# Patient Record
Sex: Male | Born: 1980 | Race: White | Hispanic: No | Marital: Single | Smoking: Former smoker
Health system: Southern US, Community
[De-identification: ages and names within clinical notes are randomized; demographics above are authoritative.]

## PROBLEM LIST (undated history)

## (undated) DIAGNOSIS — G4730 Sleep apnea, unspecified: Secondary | ICD-10-CM

## (undated) DIAGNOSIS — Z87442 Personal history of urinary calculi: Secondary | ICD-10-CM

## (undated) DIAGNOSIS — F909 Attention-deficit hyperactivity disorder, unspecified type: Secondary | ICD-10-CM

## (undated) HISTORY — DX: Sleep apnea, unspecified: G47.30

## (undated) HISTORY — DX: Attention-deficit hyperactivity disorder, unspecified type: F90.9

## (undated) HISTORY — DX: Morbid (severe) obesity due to excess calories: E66.01

## (undated) HISTORY — PX: NO PAST SURGERIES: SHX2092

---

## 2014-09-03 ENCOUNTER — Ambulatory Visit: Payer: 59 | Attending: Otolaryngology

## 2014-09-03 DIAGNOSIS — E669 Obesity, unspecified: Secondary | ICD-10-CM | POA: Diagnosis not present

## 2014-09-03 DIAGNOSIS — G4733 Obstructive sleep apnea (adult) (pediatric): Secondary | ICD-10-CM | POA: Diagnosis present

## 2014-09-03 DIAGNOSIS — R0683 Snoring: Secondary | ICD-10-CM | POA: Diagnosis not present

## 2016-03-31 ENCOUNTER — Encounter: Payer: Self-pay | Admitting: Surgery

## 2016-03-31 ENCOUNTER — Telehealth: Payer: Self-pay

## 2016-03-31 ENCOUNTER — Ambulatory Visit (INDEPENDENT_AMBULATORY_CARE_PROVIDER_SITE_OTHER): Payer: 59 | Admitting: Surgery

## 2016-03-31 VITALS — BP 142/84 | HR 76 | Temp 97.8°F | Ht 67.0 in | Wt 338.0 lb

## 2016-03-31 DIAGNOSIS — K429 Umbilical hernia without obstruction or gangrene: Secondary | ICD-10-CM | POA: Diagnosis not present

## 2016-03-31 DIAGNOSIS — G4730 Sleep apnea, unspecified: Secondary | ICD-10-CM | POA: Insufficient documentation

## 2016-03-31 HISTORY — DX: Morbid (severe) obesity due to excess calories: E66.01

## 2016-03-31 HISTORY — DX: Sleep apnea, unspecified: G47.30

## 2016-03-31 NOTE — Patient Instructions (Signed)
Please look at your Blue Sheet in case you have any questions about your surgery. 

## 2016-03-31 NOTE — Telephone Encounter (Signed)
Thank you for your online Notification/Prior Authorization submission.  The notification/prior authorization case information was transmitted on 03/31/2016 at 1:07 PM CST. The notification/prior authorization reference number is [id number]   CPT [id number]

## 2016-03-31 NOTE — Telephone Encounter (Signed)
Patient has been advised of Surgery Date as well as Pre-Admission appointment date, time, and location.  Surgery Date: 04/10/16  Pre-admit Appointment: 04/05/16 from 1p-5p  Patient has been advised to call [phone] the day before surgery between 1-3pm to obtain arrival time.

## 2016-03-31 NOTE — Progress Notes (Signed)
  Surgical Consultation  03/31/2016  Gabriel Reeves is an 36 y.o. male.   [facility]  HPI: This a patient with a painful umbilical hernia since December. He's had discomfort and some nausea but no emesis and no sign of incarceration. He does a lot of heavy lifting at work. He has no other symptoms.  He is a Nutritional therapistplumber stop smoking 7 years ago does not drink alcohol and has never been a heavy drinker.  No family history of hernias.  Past Medical History:  Diagnosis Date  . Morbid obesity (HCC) 03/31/2016  . Sleep apnea 03/31/2016    Past Surgical History:  Procedure Laterality Date  . NO PAST SURGERIES      Family History  Problem Relation Age of Onset  . Parkinson's disease Father   . Cancer Father     skin  . Healthy Mother     Social History:  reports that he quit smoking about 7 years ago. His smoking use included Cigarettes. He has a 2.00 pack-year smoking history. He has never used smokeless tobacco. He reports that he drinks alcohol. He reports that he does not use drugs.  Allergies:  Allergies  Allergen Reactions  . Bee Venom     Medications reviewed.   Review of Systems:   Review of Systems  Constitutional: Negative.   HENT: Negative.   Eyes: Negative.   Respiratory: Negative.   Cardiovascular: Negative.   Gastrointestinal: Positive for abdominal pain. Negative for constipation, diarrhea, heartburn, nausea and vomiting.  Genitourinary: Negative.   Musculoskeletal: Negative.   Skin: Negative.   Neurological: Negative.   Endo/Heme/Allergies: Negative.   Psychiatric/Behavioral: Negative.      Physical Exam:  BP (!) 142/84   Pulse 76   Temp 97.8 F (36.6 C) (Oral)   Ht 5\' 7"  (1.702 m)   Wt (!) 338 lb (153.3 kg)   BMI 52.94 kg/m   Physical Exam  Constitutional: He is oriented to person, place, and time. No distress.  Morbidly obese in no acute distress  HENT:  Head: Normocephalic and atraumatic.  Eyes: Pupils are equal, round, and reactive to light.  Right eye exhibits no discharge. Left eye exhibits no discharge. No scleral icterus.  Neck: Normal range of motion.  Cardiovascular: Normal rate, regular rhythm and normal heart sounds.   Pulmonary/Chest: Effort normal. No respiratory distress. He has no wheezes. He has no rales.  Abdominal: Soft. He exhibits no distension. There is no tenderness. There is no rebound and no guarding.  Rectus diastases Umbilical hernia with partial incarceration of likely fatty tissue. Nontender nonerythematous  Musculoskeletal: Normal range of motion. He exhibits no edema or tenderness.  Lymphadenopathy:    He has no cervical adenopathy.  Neurological: He is alert and oriented to person, place, and time.  Skin: Skin is warm and dry. No rash noted. No erythema.  Psychiatric: Mood and affect normal.  Vitals reviewed.     No results found for this or any previous visit (from the past 48 hour(s)). No results found.  Assessment/Plan: This a patient with an umbilical hernia with discomfort. I discussed with him the rationale for surgery the options of observation and the risk of bleeding infection primary repair and recurrence mesh placement mesh infection as well as cosmetic deformity he understood and agreed with this plan.  Lattie Hawichard E Breauna Mazzeo, MD, FACS

## 2016-04-05 ENCOUNTER — Encounter
Admission: RE | Admit: 2016-04-05 | Discharge: 2016-04-05 | Disposition: A | Discharge status: Home / Self Care | Payer: 59 | Source: Ambulatory Visit | Attending: Surgery | Admitting: Surgery

## 2016-04-05 DIAGNOSIS — Z01818 Encounter for other preprocedural examination: Secondary | ICD-10-CM | POA: Diagnosis not present

## 2016-04-05 HISTORY — DX: Personal history of urinary calculi: Z87.442

## 2016-04-05 LAB — CBC WITH DIFFERENTIAL/PLATELET
BASOS ABS: 0.1 10*3/uL (ref 0–0.1)
BASOS PCT: 1 %
Eosinophils Absolute: 0.3 10*3/uL (ref 0–0.7)
Eosinophils Relative: 2 %
HEMATOCRIT: 44.4 % (ref 40.0–52.0)
HEMOGLOBIN: 14.7 g/dL (ref 13.0–18.0)
LYMPHS PCT: 24 %
Lymphs Abs: 2.8 10*3/uL (ref 1.0–3.6)
MCH: 29.2 pg (ref 26.0–34.0)
MCHC: 33 g/dL (ref 32.0–36.0)
MCV: 88.3 fL (ref 80.0–100.0)
MONO ABS: 0.7 10*3/uL (ref 0.2–1.0)
MONOS PCT: 6 %
NEUTROS ABS: 7.6 10*3/uL — AB (ref 1.4–6.5)
NEUTROS PCT: 67 %
Platelets: 259 10*3/uL (ref 150–440)
RBC: 5.02 MIL/uL (ref 4.40–5.90)
RDW: 12.6 % (ref 11.5–14.5)
WBC: 11.4 10*3/uL — ABNORMAL HIGH (ref 3.8–10.6)

## 2016-04-05 LAB — BASIC METABOLIC PANEL
ANION GAP: 8 (ref 5–15)
BUN: 15 mg/dL (ref 6–20)
CALCIUM: 9.2 mg/dL (ref 8.9–10.3)
CO2: 28 mmol/L (ref 22–32)
Chloride: 103 mmol/L (ref 101–111)
Creatinine, Ser: 0.78 mg/dL (ref 0.61–1.24)
GFR calc non Af Amer: >60 mL/min (ref >60)
GLUCOSE: 106 mg/dL — AB (ref 65–99)
POTASSIUM: 4.1 mmol/L (ref 3.5–5.1)
Sodium: 139 mmol/L (ref 135–145)

## 2016-04-05 NOTE — Patient Instructions (Signed)
  Your procedure is scheduled on: 04-10-16 [facility](MONDAY) Report to Same Day Surgery 2nd floor medical mall [facility](Medical Mall Entrance-take elevator on left to 2nd floor.  Check in with surgery information desk.) To find out your arrival time please call [phone] between 1PM - 3PM on 04-07-16 (FRIDAY)   Remember: Instructions that are not followed completely may result in serious medical risk, up to and including death, or upon the discretion of your surgeon and anesthesiologist your surgery may need to be rescheduled.    _x___ 1. Do not eat food or drink liquids after midnight. No gum chewing or hard candies.     __x__ 2. No Alcohol for 24 hours before or after surgery.   __x__3. No Smoking for 24 prior to surgery.   ____  4. Bring all medications with you on the day of surgery if instructed.    __x__ 5. Notify your doctor if there is any change in your medical condition     (cold, fever, infections).     Do not wear jewelry, make-up, hairpins, clips or nail polish.  Do not wear lotions, powders, or perfumes. You may wear deodorant.  Do not shave 48 hours prior to surgery. Men may shave face and neck.  Do not bring valuables to the hospital.    [facility] is not responsible for any belongings or valuables.               Contacts, dentures or bridgework may not be worn into surgery.  Leave your suitcase in the car. After surgery it may be brought to your room.  For patients admitted to the hospital, discharge time is determined by your treatment team.   Patients discharged the day of surgery will not be allowed to drive home.  You will need someone to drive you home and stay with you the night of your procedure.    Please read over the following fact sheets that you were given:   [facility] Preparing for Surgery and or MRSA Information   ____ Take these medicines the morning of surgery with A SIP OF WATER:     1. NONE  2.  3.  4.  5.  6.  ____Fleets enema or Magnesium Citrate as  directed.   _x___ Use CHG Soap or sage wipes as directed on instruction sheet   ____ Use inhalers on the day of surgery and bring to hospital day of surgery  ____ Stop metformin 2 days prior to surgery    ____ Take 1/2 of usual insulin dose the night before surgery and none on the morning of surgery.   ____ Stop Aspirin, Coumadin, Pllavix ,Eliquis, Effient, or Pradaxa  x__ Stop Anti-inflammatories such as Advil, Aleve, Ibuprofen, Motrin, Naproxen,          Naprosyn, Goodies powders or aspirin products NOW-Ok to take Tylenol.   ____ Stop supplements until after surgery.    _X___ Bring C-Pap to the hospital.

## 2016-04-07 ENCOUNTER — Telehealth: Payer: Self-pay

## 2016-04-07 NOTE — Telephone Encounter (Signed)
Received slightly elevated White Blood Cell count. Call made to patient. He has no active signs/symptoms of infection of any type. He has not been sick lately either but has been stressed more than normal, he states. He has been encouraged to call with any signs of sickness that he experiences. He verbalizes understanding.  No further orders at this time.

## 2016-04-07 NOTE — Telephone Encounter (Signed)
Prior Authorization received for CPT code 9604549585 and Reference [id number].

## 2016-04-10 ENCOUNTER — Ambulatory Visit
Admission: RE | Admit: 2016-04-10 | Discharge: 2016-04-10 | Disposition: A | Discharge status: Home / Self Care | Payer: 59 | Source: Ambulatory Visit | Attending: Surgery | Admitting: Surgery

## 2016-04-10 ENCOUNTER — Encounter: Payer: Self-pay | Admitting: *Deleted

## 2016-04-10 ENCOUNTER — Ambulatory Visit: Payer: 59 | Admitting: Anesthesiology

## 2016-04-10 ENCOUNTER — Telehealth: Payer: Self-pay

## 2016-04-10 ENCOUNTER — Encounter
Admission: RE | Disposition: A | Discharge status: Home / Self Care | Payer: Self-pay | Source: Ambulatory Visit | Attending: Surgery

## 2016-04-10 DIAGNOSIS — G4730 Sleep apnea, unspecified: Secondary | ICD-10-CM | POA: Insufficient documentation

## 2016-04-10 DIAGNOSIS — Z87891 Personal history of nicotine dependence: Secondary | ICD-10-CM | POA: Insufficient documentation

## 2016-04-10 DIAGNOSIS — K429 Umbilical hernia without obstruction or gangrene: Secondary | ICD-10-CM | POA: Diagnosis not present

## 2016-04-10 HISTORY — PX: UMBILICAL HERNIA REPAIR: SHX196

## 2016-04-10 SURGERY — REPAIR, HERNIA, UMBILICAL, ADULT
Anesthesia: General | Wound class: Clean

## 2016-04-10 MED ORDER — FAMOTIDINE 20 MG PO TABS
ORAL_TABLET | ORAL | Status: AC
  Administered 2016-04-10: 20 mg via ORAL
  Filled 2016-04-10: qty 1

## 2016-04-10 MED ORDER — HEPARIN SODIUM (PORCINE) 5000 UNIT/ML IJ SOLN
INTRAMUSCULAR | Status: AC
  Administered 2016-04-10: 5000 [IU] via SUBCUTANEOUS
  Filled 2016-04-10: qty 1

## 2016-04-10 MED ORDER — HYDROCODONE-ACETAMINOPHEN 5-300 MG PO TABS: 1.0000 | ORAL_TABLET | ORAL | 0 refills | Status: DC | PRN

## 2016-04-10 MED ORDER — HEPARIN SODIUM (PORCINE) 5000 UNIT/ML IJ SOLN
5000.0000 [IU] | Freq: Once | INTRAMUSCULAR | Status: AC
  Administered 2016-04-10: 5000 [IU] via SUBCUTANEOUS

## 2016-04-10 MED ORDER — CHLORHEXIDINE GLUCONATE CLOTH 2 % EX PADS: 6.0000 | MEDICATED_PAD | Freq: Once | CUTANEOUS | Status: DC

## 2016-04-10 MED ORDER — ACETAMINOPHEN 10 MG/ML IV SOLN
INTRAVENOUS | Status: DC | PRN
  Administered 2016-04-10: 1000 mg via INTRAVENOUS

## 2016-04-10 MED ORDER — LACTATED RINGERS IV SOLN
INTRAVENOUS | Status: DC
  Administered 2016-04-10 (×2): via INTRAVENOUS

## 2016-04-10 MED ORDER — FENTANYL CITRATE (PF) 100 MCG/2ML IJ SOLN
INTRAMUSCULAR | Status: AC
  Filled 2016-04-10: qty 2

## 2016-04-10 MED ORDER — ONDANSETRON HCL 4 MG/2ML IJ SOLN
INTRAMUSCULAR | Status: AC
  Filled 2016-04-10: qty 2

## 2016-04-10 MED ORDER — LIDOCAINE HCL 2 % EX GEL
CUTANEOUS | Status: AC
  Filled 2016-04-10: qty 5

## 2016-04-10 MED ORDER — DEXTROSE 5 % IV SOLN
3.0000 g | INTRAVENOUS | Status: AC
  Administered 2016-04-10: 3 g via INTRAVENOUS
  Filled 2016-04-10: qty 3000

## 2016-04-10 MED ORDER — SUCCINYLCHOLINE CHLORIDE 20 MG/ML IJ SOLN
INTRAMUSCULAR | Status: DC | PRN
  Administered 2016-04-10: 200 mg via INTRAVENOUS

## 2016-04-10 MED ORDER — OXYCODONE HCL 5 MG PO TABS
5.0000 mg | ORAL_TABLET | Freq: Once | ORAL | Status: AC | PRN
  Administered 2016-04-10: 5 mg via ORAL

## 2016-04-10 MED ORDER — SUCCINYLCHOLINE CHLORIDE 20 MG/ML IJ SOLN
INTRAMUSCULAR | Status: AC
  Filled 2016-04-10: qty 1

## 2016-04-10 MED ORDER — FAMOTIDINE 20 MG PO TABS
20.0000 mg | ORAL_TABLET | Freq: Once | ORAL | Status: AC
  Administered 2016-04-10: 20 mg via ORAL

## 2016-04-10 MED ORDER — LIDOCAINE HCL (PF) 2 % IJ SOLN
INTRAMUSCULAR | Status: AC
  Filled 2016-04-10: qty 2

## 2016-04-10 MED ORDER — ROCURONIUM BROMIDE 50 MG/5ML IV SOLN
INTRAVENOUS | Status: AC
  Filled 2016-04-10: qty 2

## 2016-04-10 MED ORDER — DEXAMETHASONE SODIUM PHOSPHATE 10 MG/ML IJ SOLN
INTRAMUSCULAR | Status: DC | PRN
  Administered 2016-04-10: 10 mg via INTRAVENOUS

## 2016-04-10 MED ORDER — LIDOCAINE HCL (CARDIAC) 20 MG/ML IV SOLN
INTRAVENOUS | Status: DC | PRN
  Administered 2016-04-10: 100 mg via INTRAVENOUS

## 2016-04-10 MED ORDER — BUPIVACAINE-EPINEPHRINE (PF) 0.25% -1:200000 IJ SOLN
INTRAMUSCULAR | Status: AC
  Filled 2016-04-10: qty 30

## 2016-04-10 MED ORDER — OXYCODONE HCL 5 MG/5ML PO SOLN: 5.0000 mg | Freq: Once | ORAL | Status: AC | PRN

## 2016-04-10 MED ORDER — FENTANYL CITRATE (PF) 100 MCG/2ML IJ SOLN
25.0000 ug | INTRAMUSCULAR | Status: DC | PRN
  Administered 2016-04-10 (×2): 50 ug via INTRAVENOUS

## 2016-04-10 MED ORDER — ACETAMINOPHEN 10 MG/ML IV SOLN
INTRAVENOUS | Status: AC
  Filled 2016-04-10: qty 100

## 2016-04-10 MED ORDER — PROPOFOL 10 MG/ML IV BOLUS
INTRAVENOUS | Status: AC
  Filled 2016-04-10: qty 40

## 2016-04-10 MED ORDER — DEXAMETHASONE SODIUM PHOSPHATE 10 MG/ML IJ SOLN
INTRAMUSCULAR | Status: AC
  Filled 2016-04-10: qty 1

## 2016-04-10 MED ORDER — SUGAMMADEX SODIUM 500 MG/5ML IV SOLN
INTRAVENOUS | Status: AC
  Filled 2016-04-10: qty 5

## 2016-04-10 MED ORDER — OXYCODONE HCL 5 MG PO TABS
ORAL_TABLET | ORAL | Status: AC
  Filled 2016-04-10: qty 1

## 2016-04-10 MED ORDER — ROCURONIUM BROMIDE 100 MG/10ML IV SOLN
INTRAVENOUS | Status: DC | PRN
  Administered 2016-04-10: 40 mg via INTRAVENOUS
  Administered 2016-04-10: 10 mg via INTRAVENOUS

## 2016-04-10 MED ORDER — BUPIVACAINE-EPINEPHRINE (PF) 0.25% -1:200000 IJ SOLN
INTRAMUSCULAR | Status: DC | PRN
  Administered 2016-04-10: 30 mL via PERINEURAL

## 2016-04-10 MED ORDER — PROPOFOL 10 MG/ML IV BOLUS
INTRAVENOUS | Status: DC | PRN
  Administered 2016-04-10: 300 mg via INTRAVENOUS

## 2016-04-10 MED ORDER — MIDAZOLAM HCL 2 MG/2ML IJ SOLN
INTRAMUSCULAR | Status: AC
  Filled 2016-04-10: qty 2

## 2016-04-10 MED ORDER — ONDANSETRON HCL 4 MG/2ML IJ SOLN
INTRAMUSCULAR | Status: DC | PRN
Start: 2016-04-10 — End: 2016-04-10
  Administered 2016-04-10: 4 mg via INTRAVENOUS

## 2016-04-10 MED ORDER — FENTANYL CITRATE (PF) 100 MCG/2ML IJ SOLN
INTRAMUSCULAR | Status: DC | PRN
  Administered 2016-04-10: 100 ug via INTRAVENOUS

## 2016-04-10 MED ORDER — SUGAMMADEX SODIUM 500 MG/5ML IV SOLN
INTRAVENOUS | Status: DC | PRN
  Administered 2016-04-10: 306.6 mg via INTRAVENOUS

## 2016-04-10 MED ORDER — MIDAZOLAM HCL 2 MG/2ML IJ SOLN
INTRAMUSCULAR | Status: DC | PRN
Start: 2016-04-10 — End: 2016-04-10
  Administered 2016-04-10: 2 mg via INTRAVENOUS

## 2016-04-10 SURGICAL SUPPLY — 31 items
ADHESIVE MASTISOL STRL (MISCELLANEOUS) ×3 IMPLANT
CANISTER SUCT 1200ML W/VALVE (MISCELLANEOUS) ×3 IMPLANT
CHLORAPREP W/TINT 26ML (MISCELLANEOUS) ×3 IMPLANT
CLOSURE WOUND 1/2 X4 (GAUZE/BANDAGES/DRESSINGS) ×1
CLOSURE WOUND 1/4X4 (GAUZE/BANDAGES/DRESSINGS) ×1
DRAPE LAPAROTOMY 100X77 ABD (DRAPES) ×3 IMPLANT
ELECT REM PT RETURN 9FT ADLT (ELECTROSURGICAL) ×3
ELECTRODE REM PT RTRN 9FT ADLT (ELECTROSURGICAL) ×1 IMPLANT
GAUZE SPONGE NON-WVN 2X2 STRL (MISCELLANEOUS) ×2 IMPLANT
GLOVE BIO SURGEON STRL SZ8 (GLOVE) ×6 IMPLANT
GOWN STRL REUS W/ TWL LRG LVL3 (GOWN DISPOSABLE) ×2 IMPLANT
GOWN STRL REUS W/TWL LRG LVL3 (GOWN DISPOSABLE) ×4
LABEL OR SOLS (LABEL) ×3 IMPLANT
MESH VENTRALEX ST 1-7/10 CRC S (Mesh General) ×3 IMPLANT
NDL SAFETY 22GX1.5 (NEEDLE) ×3 IMPLANT
NS IRRIG 500ML POUR BTL (IV SOLUTION) ×3 IMPLANT
PACK BASIN MINOR ARMC (MISCELLANEOUS) ×3 IMPLANT
SPONGE LAP 18X18 5 PK (GAUZE/BANDAGES/DRESSINGS) ×3 IMPLANT
SPONGE VERSALON 2X2 STRL (MISCELLANEOUS) ×4
STRIP CLOSURE SKIN 1/2X4 (GAUZE/BANDAGES/DRESSINGS) ×2 IMPLANT
STRIP CLOSURE SKIN 1/4X4 (GAUZE/BANDAGES/DRESSINGS) ×2 IMPLANT
SUT ETHIBOND 0 (SUTURE) ×3 IMPLANT
SUT ETHIBOND NAB CT1 #1 30IN (SUTURE) ×3 IMPLANT
SUT MNCRL 4-0 (SUTURE) ×2
SUT MNCRL 4-0 27XMFL (SUTURE) ×1
SUT PROLENE 0 CT 1 30 (SUTURE) ×15 IMPLANT
SUT PROLENE 1 CT 1 30 (SUTURE) ×12 IMPLANT
SUT VIC AB 3-0 SH 27 (SUTURE) ×4
SUT VIC AB 3-0 SH 27X BRD (SUTURE) ×2 IMPLANT
SUTURE MNCRL 4-0 27XMF (SUTURE) ×1 IMPLANT
SYRINGE 10CC LL (SYRINGE) ×3 IMPLANT

## 2016-04-10 NOTE — Op Note (Signed)
Abdominal Hernia Repair  Repair of umbilical hernia with mesh Pre-operative Diagnosis: Umbilical hernia  Post-operative Diagnosis: Umbilical hernia  Surgeon: Adah Salvageichard E. Excell Seltzerooper, MD FACS  Anesthesia: Gen. with endotracheal tube  Assistant: PA student  Procedure Details  The patient was seen again in the Holding Room. The benefits, complications, treatment options, and expected outcomes were discussed with the patient. The risks of bleeding, infection, recurrence of symptoms, failure to resolve symptoms, bowel injury, mesh placement, mesh infection, any of which could require further surgery were reviewed with the patient. The likelihood of improving the patient's symptoms with return to their baseline status is good.  The patient and/or family concurred with the proposed plan, giving informed consent.  The patient was taken to Operating Room, identified as Gabriel Reeves and the procedure verified.  A Time Out was held and the above information confirmed.  Prior to the induction of general anesthesia, antibiotic prophylaxis was administered. VTE prophylaxis was in place. General endotracheal anesthesia was then administered and tolerated well. After the induction, the abdomen was prepped with Chloraprep and draped in the sterile fashion. The patient was positioned in the supine position.  A curvilinear incision was made in the infraumbilical area and dissection down to the fascia was performed with electrocautery. The skin of the umbilicus was elevated and hernia was noted with incarcerated preperitoneal fat. This was reduced and a 1.5 cm rent was identified. With the patient's morbid obesity as well as the weak tissues noted and the size of the hernia was decided to place a 4.3 cm ventral X patch with coating into the abdominal cavity and tied at in with figure-of-eight and U sutures of 0 Prolene. Once this was complete additional Marcaine was placed for a total of 30 cc and then the skin of the  umbilicus was tacked to the fascia with 3-0 Vicryl. Deep sutures of 3-0 Vicryl were placed followed by 4-0 subcuticular Monocryl. Steri-Strips Mastisol sterile dressings were placed.  Patient was taken to recovery room in stable condition to be discharged care of his family and follow-up in 10 days  Findings:  Umbilical hernia with incarcerated preperitoneal fat  Estimated Blood Loss: Minimal         Drains: None         Specimens: None       Complications: none               Gabriel Reeves E. Excell Seltzerooper, MD, FACS

## 2016-04-10 NOTE — Telephone Encounter (Signed)
Dr. Excell Seltzerooper did a Umbilical hernia repair today and gave pt's fiance a rx for Vicodin 5/300mg  and his insurance will only cover Norvo 5/325mg . Pharmacist says you can escribe this medication. Please send to [facility]. Pt is due for his next dose at 6:00pm

## 2016-04-10 NOTE — Transfer of Care (Signed)
Immediate Anesthesia Transfer of Care Note  Patient: Gabriel Reeves  Procedure(s) Performed: Procedure(s): HERNIA REPAIR UMBILICAL ADULT (N/A)  Patient Location: PACU  Anesthesia Type:General  Level of Consciousness: awake and oriented  Airway & Oxygen Therapy: Patient Spontanous Breathing and Patient connected to face mask oxygen  Post-op Assessment: Report given to RN and Post -op Vital signs reviewed and stable  Post vital signs: Reviewed and stable  Last Vitals:  Vitals:   04/10/16 1106  BP: (!) 155/93  Pulse: 75  Resp: 18  Temp: 37 C    Last Pain:  Vitals:   04/10/16 1106  TempSrc: Tympanic  PainSc: 0-No pain         Complications: No apparent anesthesia complications

## 2016-04-10 NOTE — OR Nursing (Signed)
Upon admission patient reported that he thinks he might still have a contact in his left eye. He reports spending a lot of time last night trying to remove it without success. He attempted to remove it again today but reports the eye was too sensitive to mess with. Two nurses as well as Dr. Randa NgoPiscitello looked in the patient's eye without evidence of a contact still being in there. Patient really really wants to proceed with surgery today due to insurance reasons. Left eye with very reddened sclera but no obvious contact in place. After discussion with Dr. Randa NgoPiscitello it was decided to proceed with surgery. Dr. Excell Seltzerooper is also aware of the situation with this patient's eye also.

## 2016-04-10 NOTE — Anesthesia Post-op Follow-up Note (Cosign Needed)
Anesthesia QCDR form completed.        

## 2016-04-10 NOTE — Telephone Encounter (Signed)
Call made to pharmacy at this time. Spoke with Pharmacist and gave her verbal order to change prescription to Norco 5/325mg  tablets so that they would be covered by insurance.

## 2016-04-10 NOTE — Anesthesia Procedure Notes (Signed)
Procedure Name: Intubation Date/Time: 04/10/2016 12:23 PM Performed by: Nelda Marseille Pre-anesthesia Checklist: Patient identified, Patient being monitored, Timeout performed, Emergency Drugs available and Suction available Patient Re-evaluated:Patient Re-evaluated prior to inductionOxygen Delivery Method: Circle system utilized Preoxygenation: Pre-oxygenation with 100% oxygen Intubation Type: IV induction Ventilation: Mask ventilation without difficulty Laryngoscope Size: Mac and 3 Grade View: Grade IV Tube type: Oral Tube size: 7.5 mm Number of attempts: 1 Airway Equipment and Method: Stylet and Video-laryngoscopy Placement Confirmation: ETT inserted through vocal cords under direct vision,  positive ETCO2 and breath sounds checked- equal and bilateral Secured at: 25 cm Tube secured with: Tape Dental Injury: Teeth and Oropharynx as per pre-operative assessment  Difficulty Due To: Difficulty was anticipated, Difficult Airway- due to large tongue, Difficult Airway- due to reduced neck mobility and Difficult Airway-  due to edematous airway Future Recommendations: Recommend- induction with short-acting agent, and alternative techniques readily available Comments: Recommend intubation with video layrngoscopy always

## 2016-04-10 NOTE — Progress Notes (Signed)
Preoperative Review   Patient is met in the preoperative holding area. The history is reviewed in the chart and with the patient. I personally reviewed the options and rationale as well as the risks of this procedure that have been previously discussed with the patient. All questions asked by the patient and/or family were answered to their satisfaction.  Patient agrees to proceed with this procedure at this time.  Richard E Cooper M.D. FACS  

## 2016-04-10 NOTE — Discharge Instructions (Signed)
Remove dressing in 24 hours. °May shower in 24 hours. °Leave paper strips in place. °Resume all home medications. °Follow-up with Dr. Cooper in 10 days. ° ° ° ° ° °General Anesthesia, Adult, Care After °These instructions provide you with information about caring for yourself after your procedure. Your health care provider may also give you more specific instructions. Your treatment has been planned according to current medical practices, but problems sometimes occur. Call your health care provider if you have any problems or questions after your procedure. °What can I expect after the procedure? °After the procedure, it is common to have: °· Vomiting. °· A sore throat. °· Mental slowness. °It is common to feel: °· Nauseous. °· Cold or shivery. °· Sleepy. °· Tired. °· Sore or achy, even in parts of your body where you did not have surgery. °Follow these instructions at home: °For at least 24 hours after the procedure: °· Do not: °¨ Participate in activities where you could fall or become injured. °¨ Drive. °¨ Use heavy machinery. °¨ Drink alcohol. °¨ Take sleeping pills or medicines that cause drowsiness. °¨ Make important decisions or sign legal documents. °¨ Take care of children on your own. °· Rest. °Eating and drinking °· If you vomit, drink water, juice, or soup when you can drink without vomiting. °· Drink enough fluid to keep your urine clear or pale yellow. °· Make sure you have little or no nausea before eating solid foods. °· Follow the diet recommended by your health care provider. °General instructions °· Have a responsible adult stay with you until you are awake and alert. °· Return to your normal activities as told by your health care provider. Ask your health care provider what activities are safe for you. °· Take over-the-counter and prescription medicines only as told by your health care provider. °· If you smoke, do not smoke without supervision. °· Keep all follow-up visits as told by your health  care provider. This is important. °Contact a health care provider if: °· You continue to have nausea or vomiting at home, and medicines are not helpful. °· You cannot drink fluids or start eating again. °· You cannot urinate after 8-12 hours. °· You develop a skin rash. °· You have fever. °· You have increasing redness at the site of your procedure. °Get help right away if: °· You have difficulty breathing. °· You have chest pain. °· You have unexpected bleeding. °· You feel that you are having a life-threatening or urgent problem. °This information is not intended to replace advice given to you by your health care provider. Make sure you discuss any questions you have with your health care provider. °Document Released: 05/15/2000 Document Revised: 07/12/2015 Document Reviewed: 01/21/2015 °Elsevier Interactive Patient Education © 2017 [employer] ° °

## 2016-04-10 NOTE — Anesthesia Preprocedure Evaluation (Addendum)
Anesthesia Evaluation  Patient identified by MRN, date of birth, ID band Patient awake    Reviewed: Allergy & Precautions, H&P , NPO status , Patient's Chart, lab work & pertinent test results  Airway Mallampati: III  TM Distance: >3 FB Neck ROM: full    Dental  (+) Poor Dentition, Chipped   Pulmonary neg shortness of breath, sleep apnea and Continuous Positive Airway Pressure Ventilation , former smoker,    Pulmonary exam normal breath sounds clear to auscultation       Cardiovascular Exercise Tolerance: Good (-) angina(-) Past MI and (-) DOE negative cardio ROS Normal cardiovascular exam Rhythm:regular Rate:Normal     Neuro/Psych PSYCHIATRIC DISORDERS negative neurological ROS     GI/Hepatic Neg liver ROS, Controlled,  Endo/Other  negative endocrine ROS  Renal/GU      Musculoskeletal   Abdominal   Peds  Hematology negative hematology ROS (+)   Anesthesia Other Findings Baseline pain, erythema and injection in left eye.  Patient reports that he had trouble removing his contact from this eye last night, that there is no contact in that eye right now.  No contact visualized in that eye.  Patient told that he may have a baseline corneal abrasion in that eye.  Past Medical History: No date: ADHD No date: History of kidney stones 03/31/2016: Morbid obesity (HCC) 03/31/2016: Sleep apnea     Comment: USES CPAP  Past Surgical History: No date: NO PAST SURGERIES     Reproductive/Obstetrics negative OB ROS                            Anesthesia Physical Anesthesia Plan  ASA: III  Anesthesia Plan: General ETT   Post-op Pain Management:    Induction:   Airway Management Planned:   Additional Equipment:   Intra-op Plan:   Post-operative Plan:   Informed Consent: I have reviewed the patients History and Physical, chart, labs and discussed the procedure including the risks, benefits and  alternatives for the proposed anesthesia with the patient or authorized representative who has indicated his/her understanding and acceptance.   Dental Advisory Given  Plan Discussed with: Anesthesiologist, CRNA and Surgeon  Anesthesia Plan Comments:         Anesthesia Quick Evaluation

## 2016-04-10 NOTE — Anesthesia Postprocedure Evaluation (Signed)
Anesthesia Post Note  Patient: Rexene AgentSean Griffing  Procedure(s) Performed: Procedure(s) (LRB): HERNIA REPAIR UMBILICAL ADULT (N/A)  Patient location during evaluation: PACU Anesthesia Type: General Level of consciousness: awake and alert Pain management: pain level controlled Vital Signs Assessment: post-procedure vital signs reviewed and stable Respiratory status: spontaneous breathing, nonlabored ventilation, respiratory function stable and patient connected to nasal cannula oxygen Cardiovascular status: blood pressure returned to baseline and stable Postop Assessment: no signs of nausea or vomiting Anesthetic complications: no     Last Vitals:  Vitals:   04/10/16 1318 04/10/16 1325  BP:  119/88  Pulse: 72 71  Resp: 16 16  Temp:      Last Pain:  Vitals:   04/10/16 1318  TempSrc:   PainSc: 5                  Cleda MccreedyJoseph K Shley Dolby

## 2016-04-11 ENCOUNTER — Encounter: Payer: Self-pay | Admitting: Surgery

## 2016-04-18 ENCOUNTER — Encounter: Payer: 59 | Admitting: Surgery

## 2019-02-13 ENCOUNTER — Encounter: Payer: Self-pay | Admitting: Emergency Medicine

## 2019-02-13 ENCOUNTER — Emergency Department
Admission: EM | Admit: 2019-02-13 | Discharge: 2019-02-13 | Disposition: A | Discharge status: Home / Self Care | Payer: 59 | Attending: Emergency Medicine | Admitting: Emergency Medicine

## 2019-02-13 ENCOUNTER — Other Ambulatory Visit: Payer: Self-pay

## 2019-02-13 ENCOUNTER — Emergency Department: Payer: 59

## 2019-02-13 DIAGNOSIS — R109 Unspecified abdominal pain: Secondary | ICD-10-CM

## 2019-02-13 DIAGNOSIS — Z79899 Other long term (current) drug therapy: Secondary | ICD-10-CM | POA: Insufficient documentation

## 2019-02-13 DIAGNOSIS — N200 Calculus of kidney: Secondary | ICD-10-CM | POA: Diagnosis not present

## 2019-02-13 DIAGNOSIS — Z87891 Personal history of nicotine dependence: Secondary | ICD-10-CM | POA: Diagnosis not present

## 2019-02-13 LAB — URINALYSIS, COMPLETE (UACMP) WITH MICROSCOPIC
Bacteria, UA: NONE SEEN
Bilirubin Urine: NEGATIVE
Glucose, UA: NEGATIVE mg/dL
Ketones, ur: NEGATIVE mg/dL
Leukocytes,Ua: NEGATIVE
Nitrite: NEGATIVE
Protein, ur: NEGATIVE mg/dL
Specific Gravity, Urine: 1.011 (ref 1.005–1.030)
Squamous Epithelial / LPF: NONE SEEN (ref 0–5)
pH: 5 (ref 5.0–8.0)

## 2019-02-13 LAB — CBC
HCT: 43.7 % (ref 39.0–52.0)
Hemoglobin: 15.6 g/dL (ref 13.0–17.0)
MCH: 30.1 pg (ref 26.0–34.0)
MCHC: 35.7 g/dL (ref 30.0–36.0)
MCV: 84.4 fL (ref 80.0–100.0)
Platelets: 232 10*3/uL (ref 150–400)
RBC: 5.18 MIL/uL (ref 4.22–5.81)
RDW: 11.9 % (ref 11.5–15.5)
WBC: 10.1 10*3/uL (ref 4.0–10.5)
nRBC: 0 % (ref 0.0–0.2)

## 2019-02-13 LAB — BASIC METABOLIC PANEL
Anion gap: 13 (ref 5–15)
BUN: 14 mg/dL (ref 6–20)
CO2: 21 mmol/L — ABNORMAL LOW (ref 22–32)
Calcium: 8.8 mg/dL — ABNORMAL LOW (ref 8.9–10.3)
Chloride: 106 mmol/L (ref 98–111)
Creatinine, Ser: 0.7 mg/dL (ref 0.61–1.24)
GFR calc Af Amer: >60 mL/min (ref >60)
GFR calc non Af Amer: >60 mL/min (ref >60)
Glucose, Bld: 169 mg/dL — ABNORMAL HIGH (ref 70–99)
Potassium: 4.2 mmol/L (ref 3.5–5.1)
Sodium: 140 mmol/L (ref 135–145)

## 2019-02-13 MED ORDER — OXYCODONE-ACETAMINOPHEN 7.5-325 MG PO TABS: 1.0000 | ORAL_TABLET | Freq: Four times a day (QID) | ORAL | 0 refills | Status: DC | PRN

## 2019-02-13 MED ORDER — TAMSULOSIN HCL 0.4 MG PO CAPS: 0.4000 mg | ORAL_CAPSULE | Freq: Every day | ORAL | 0 refills | Status: AC

## 2019-02-13 MED ORDER — HYDROCODONE-ACETAMINOPHEN 7.5-325 MG PO TABS: 1.0000 | ORAL_TABLET | Freq: Four times a day (QID) | ORAL | 0 refills | Status: AC | PRN

## 2019-02-13 MED ORDER — FENTANYL CITRATE (PF) 100 MCG/2ML IJ SOLN
25.0000 ug | Freq: Once | INTRAMUSCULAR | Status: AC
  Administered 2019-02-13: 25 ug via INTRAMUSCULAR
  Filled 2019-02-13: qty 2

## 2019-02-13 MED ORDER — ONDANSETRON 4 MG PO TBDP: 4.0000 mg | ORAL_TABLET | Freq: Three times a day (TID) | ORAL | 0 refills | Status: AC | PRN

## 2019-02-13 MED ORDER — KETOROLAC TROMETHAMINE 30 MG/ML IJ SOLN
30.0000 mg | Freq: Once | INTRAMUSCULAR | Status: AC
  Administered 2019-02-13: 30 mg via INTRAMUSCULAR
  Filled 2019-02-13: qty 1

## 2019-02-13 NOTE — ED Triage Notes (Signed)
Left flank pain x1 week , on and off , increased pain this AM , increased in urinary frequency.

## 2019-02-13 NOTE — ED Provider Notes (Signed)
[facility] Emergency Department Provider Note  ___________________________________________   First MD Initiated Contact with Patient 02/13/19 0831     (approximate)  I have reviewed the triage vital signs and the nursing notes.   HISTORY  Chief Complaint Flank Pain   HPI Gabriel Reeves is a 38 y.o. male presents to the ED with complaint of left flank pain.  Patient states that it began intermittently for 1 week but early this morning pain increased.  Patient states he has had some nausea but no vomiting.  He denies any previous stones but family members are positive for stones.  Patient denies any fever, chills, visible hematuria.  Currently rates his pain as a 6 out of 10.     Past Medical History:  Diagnosis Date  . ADHD   . History of kidney stones   . Morbid obesity (HCC) 03/31/2016  . Sleep apnea 03/31/2016   USES CPAP    Patient Active Problem List   Diagnosis Date Noted  . Umbilical hernia without obstruction and without gangrene   . Sleep apnea 03/31/2016  . Morbid obesity (HCC) 03/31/2016    Past Surgical History:  Procedure Laterality Date  . NO PAST SURGERIES    . UMBILICAL HERNIA REPAIR N/A 04/10/2016   Procedure: HERNIA REPAIR UMBILICAL ADULT;  Surgeon: Lattie Hawichard E Cooper, MD;  Location: [facility] ORS;  Service: General;  Laterality: N/A;    Prior to Admission medications   Medication Sig Start Date End Date Taking? Authorizing Provider  buPROPion (WELLBUTRIN) 75 MG tablet Take 225 tablets by mouth 2 (two) times daily.  03/04/16   [provider]  HYDROcodone-acetaminophen (NORCO) 7.5-325 MG tablet Take 1 tablet by mouth every 6 (six) hours as needed for moderate pain. 02/13/19   Tommi RumpsSummers, Arnetia Bronk L, PA-C  ibuprofen (ADVIL,MOTRIN) 800 MG tablet Take 800 mg by mouth 2 (two) times daily as needed (for pain.).  03/23/16   [provider]  ondansetron (ZOFRAN ODT) 4 MG disintegrating tablet Take 1 tablet (4 mg total) by mouth every 8  (eight) hours as needed for nausea or vomiting. 02/13/19   Tommi RumpsSummers, Levy Cedano L, PA-C  tamsulosin (FLOMAX) 0.4 MG CAPS capsule Take 1 capsule (0.4 mg total) by mouth daily. 02/13/19   Tommi RumpsSummers, Hena Ewalt L, PA-C  traZODone (DESYREL) 50 MG tablet Take 50 mg by mouth at bedtime.  03/28/16   [provider]    Allergies Bee venom  Family History  Problem Relation Age of Onset  . Parkinson's disease Father   . Cancer Father        skin  . Healthy Mother     Social History Social History   Tobacco Use  . Smoking status: Former Smoker    Packs/day: 1.00    Years: 2.00    Pack years: 2.00    Types: Cigarettes    Quit date: 02/20/2009    Years since quitting: 9.9  . Smokeless tobacco: Never Used  Substance Use Topics  . Alcohol use: Yes    Comment: ocass  . Drug use: No    Review of Systems Constitutional: No fever/chills Cardiovascular: Denies chest pain. Respiratory: Denies shortness of breath. Gastrointestinal: No abdominal pain.  Positive nausea, no vomiting.  Genitourinary: Negative for dysuria.   Positive left flank pain. Musculoskeletal: Negative for back pain. Skin: Negative for rash. Neurological: Negative for headaches, focal weakness or numbness. ____________________________________________   PHYSICAL EXAM:  VITAL SIGNS: ED Triage Vitals [02/13/19 0826]  Enc Vitals Group  BP (!) 145/85     Pulse Rate 80     Resp (!) 22     Temp 98.9 F (37.2 C)     Temp Source Oral     SpO2 96 %     Weight (!) 375 lb (170.1 kg)     Height 5\' 7"  (1.702 m)     Head Circumference      Peak Flow      Pain Score 6     Pain Loc      Pain Edu?      Excl. in [place]?    Constitutional: Alert and oriented. Well appearing and in no acute distress. Eyes: Conjunctivae are normal.  Head: Atraumatic. Neck: No stridor.   Cardiovascular: Normal rate, regular rhythm. Grossly normal heart sounds.  Good peripheral circulation. Respiratory: Normal respiratory effort.  No  retractions. Lungs CTAB. Gastrointestinal: Soft and nontender. No distention.  Left flank tenderness. Musculoskeletal: Moves upper and lower extremities without any difficulty.  Normal gait noted. Neurologic:  Normal speech and language. No gross focal neurologic deficits are appreciated. No gait instability. Skin:  Skin is warm, dry and intact. No rash noted. Psychiatric: Mood and affect are normal. Speech and behavior are normal.  ____________________________________________   LABS (all labs ordered are listed, but only abnormal results are displayed)  Labs Reviewed  URINALYSIS, COMPLETE (UACMP) WITH MICROSCOPIC - Abnormal; Notable for the following components:      Result Value   Color, Urine YELLOW (*)    APPearance CLEAR (*)    Hgb urine dipstick MODERATE (*)    All other components within normal limits  BASIC METABOLIC PANEL - Abnormal; Notable for the following components:   CO2 21 (*)    Glucose, Bld 169 (*)    Calcium 8.8 (*)    All other components within normal limits  CBC    RADIOLOGY  Official radiology report(s): CT Renal Stone Study  Result Date: 02/13/2019 CLINICAL DATA:  38 year old presenting with acute onset of LEFT flank pain associated with nausea that began yesterday. Surgical history includes umbilical hernia repair. EXAM: CT ABDOMEN AND PELVIS WITHOUT CONTRAST TECHNIQUE: Multidetector CT imaging of the abdomen and pelvis was performed following the standard protocol without IV contrast. COMPARISON:  None. FINDINGS: Lower chest: Heart size normal.  Visualized lung bases clear. Hepatobiliary: Diffuse hepatic steatosis with focal sparing surrounding the gallbladder. Allowing for the unenhanced technique, no focal parenchymal abnormality involving the liver. Gallbladder normal in appearance without calcified gallstones. No biliary ductal dilation. Pancreas: Normal in appearance without evidence of mass, ductal dilation, or inflammation. Spleen: Normal unenhanced  appearance. Focus of accessory splenic tissue anterior to the lower pole. Adrenals/Urinary Tract: Normal appearing adrenal glands. Approximate 3 mm calculus at the LEFT UVJ causing mild LEFT hydronephrosis, LEFT renal edema and LEFT perinephric edema. No urinary tract calculi elsewhere on either side. Allowing for the unenhanced technique, no focal parenchymal abnormality involving either kidney. Normal appearing decompressed urinary bladder. Stomach/Bowel: Stomach normal in appearance for the degree of distention. Normal-appearing small bowel. Normal-appearing colon with expected stool burden. Normal appendix in the RIGHT UPPER pelvis. Vascular/Lymphatic: No visible atherosclerosis. No pathologic lymphadenopathy. Reproductive: Prostate gland and seminal vesicles normal in size and appearance for age. Other: No evidence of recurrent umbilical hernia. 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 No acute findings. IMPRESSION: 1. Obstructing approximate 3 mm calculus at the LEFT UVJ. 2. No urinary tract calculi elsewhere on either  side. 3. Diffuse hepatic steatosis with focal sparing surrounding the gallbladder. 4. Degenerative disc disease and spondylosis at L4-5 and L5-S1 with moderate multifactorial spinal stenosis at L4-5. Electronically Signed   By: Hulan Saas M.D.   On: 02/13/2019 10:00    ____________________________________________   PROCEDURES  Procedure(s) performed (including Critical Care):  Procedures   ____________________________________________   INITIAL IMPRESSION / ASSESSMENT AND PLAN / ED COURSE  As part of my medical decision making, I reviewed the following data within the electronic MEDICAL RECORD NUMBER Notes from prior ED visits and [place] Controlled Substance Database  Gabriel Reeves was evaluated in Emergency Department on 02/13/2019 for the symptoms described in the history of present illness.  He was evaluated in the context of the global COVID-19 pandemic, which necessitated consideration that the patient might be at risk for infection with the SARS-CoV-2 virus that causes COVID-19. Institutional protocols and algorithms that pertain to the evaluation of patients at risk for COVID-19 are in a state of rapid change based on information released by regulatory bodies including the CDC and federal and state organizations. These policies and algorithms were followed during the patient's care in the ED.  38 year old male presents to the ED with complaint of left flank pain intermittently for the last week which is worsened during the early hours of today.  Patient states he has had some nausea but no vomiting.  He denies any previous history of stones.  Patient denies any fever, chills or gross hematuria.  Urinalysis is suspicious for a stone and CT confirms that patient does have a 3 mm stone on the left.  Patient was made aware.  He will follow-up with the urologist on call and is aware that if there is any severe worsening of his symptoms to return to the emergency department over the holiday weekend.  A prescription for Flomax, Norco and Zofran was sent to his pharmacy.  He is encouraged to increase fluids.    ____________________________________________   FINAL CLINICAL IMPRESSION(S) / ED DIAGNOSES  Final diagnoses:  Kidney stone  Left flank pain     ED Discharge Orders         Ordered    oxyCODONE-acetaminophen (PERCOCET) 7.5-325 MG tablet  Every 6 hours PRN,   Status:  Discontinued     02/13/19 1027    ondansetron (ZOFRAN ODT) 4 MG disintegrating tablet  Every 8 hours PRN     02/13/19 1027    tamsulosin (FLOMAX) 0.4 MG CAPS capsule  Daily     02/13/19 1029    HYDROcodone-acetaminophen (NORCO) 7.5-325 MG tablet  Every 6 hours PRN     02/13/19 1038           Note:  This document was prepared using Dragon voice recognition software and may include unintentional dictation  errors.    Tommi Rumps, PA-C 02/13/19 1105    Arnaldo Natal, MD 02/13/19 [phone]

## 2019-02-13 NOTE — ED Notes (Signed)

## 2019-02-13 NOTE — Discharge Instructions (Signed)
Call make an appointment with the urologist listed on your discharge papers.  Drink lots of fluids.  Begin taking medication as prescribed.  Zofran is for nausea and vomiting, Flomax and hydrocodone.  Do not take medication and drive as the hydrocodone can cause drowsiness.  Return to the emergency department if any severe worsening of your symptoms over the holiday such as fever, chills, nausea or vomiting.

## 2019-03-22 ENCOUNTER — Other Ambulatory Visit: Payer: Self-pay

## 2019-03-22 ENCOUNTER — Emergency Department: Payer: 59

## 2019-03-22 DIAGNOSIS — Z5329 Procedure and treatment not carried out because of patient's decision for other reasons: Secondary | ICD-10-CM | POA: Insufficient documentation

## 2019-03-22 DIAGNOSIS — Y92410 Unspecified street and highway as the place of occurrence of the external cause: Secondary | ICD-10-CM | POA: Diagnosis not present

## 2019-03-22 DIAGNOSIS — Y9389 Activity, other specified: Secondary | ICD-10-CM | POA: Insufficient documentation

## 2019-03-22 DIAGNOSIS — Z79899 Other long term (current) drug therapy: Secondary | ICD-10-CM | POA: Diagnosis not present

## 2019-03-22 DIAGNOSIS — Z87891 Personal history of nicotine dependence: Secondary | ICD-10-CM | POA: Diagnosis not present

## 2019-03-22 DIAGNOSIS — Y999 Unspecified external cause status: Secondary | ICD-10-CM | POA: Diagnosis not present

## 2019-03-22 DIAGNOSIS — R1011 Right upper quadrant pain: Secondary | ICD-10-CM | POA: Diagnosis not present

## 2019-03-22 DIAGNOSIS — R079 Chest pain, unspecified: Secondary | ICD-10-CM | POA: Diagnosis present

## 2019-03-22 LAB — CBC
HCT: 44.8 % (ref 39.0–52.0)
Hemoglobin: 15.3 g/dL (ref 13.0–17.0)
MCH: 30.3 pg (ref 26.0–34.0)
MCHC: 34.2 g/dL (ref 30.0–36.0)
MCV: 88.7 fL (ref 80.0–100.0)
Platelets: 267 10*3/uL (ref 150–400)
RBC: 5.05 MIL/uL (ref 4.22–5.81)
RDW: 12.1 % (ref 11.5–15.5)
WBC: 12.9 10*3/uL — ABNORMAL HIGH (ref 4.0–10.5)
nRBC: 0 % (ref 0.0–0.2)

## 2019-03-22 LAB — BASIC METABOLIC PANEL
Anion gap: 14 (ref 5–15)
BUN: 12 mg/dL (ref 6–20)
CO2: 20 mmol/L — ABNORMAL LOW (ref 22–32)
Calcium: 9.3 mg/dL (ref 8.9–10.3)
Chloride: 104 mmol/L (ref 98–111)
Creatinine, Ser: 0.78 mg/dL (ref 0.61–1.24)
GFR calc Af Amer: >60 mL/min (ref >60)
GFR calc non Af Amer: >60 mL/min (ref >60)
Glucose, Bld: 116 mg/dL — ABNORMAL HIGH (ref 70–99)
Potassium: 4.1 mmol/L (ref 3.5–5.1)
Sodium: 138 mmol/L (ref 135–145)

## 2019-03-22 MED ORDER — OXYCODONE-ACETAMINOPHEN 5-325 MG PO TABS
1.0000 | ORAL_TABLET | ORAL | Status: DC | PRN
  Administered 2019-03-22: 1 via ORAL
  Filled 2019-03-22: qty 1

## 2019-03-22 NOTE — ED Triage Notes (Signed)
Transported to xray 
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
## 2019-03-22 NOTE — ED Notes (Signed)
Patient to stat desk speaking in complete sentences stating that he is having difficulty breathing and requesting pain medication. Hr 83, bp 162/89 and saturation 96% on room air.

## 2019-03-23 ENCOUNTER — Emergency Department
Admission: EM | Admit: 2019-03-23 | Discharge: 2019-03-23 | Discharge status: Against Medical Advice | Payer: 59 | Attending: Emergency Medicine | Admitting: Emergency Medicine

## 2019-03-23 ENCOUNTER — Emergency Department: Payer: 59

## 2019-03-23 DIAGNOSIS — R079 Chest pain, unspecified: Secondary | ICD-10-CM

## 2019-03-23 DIAGNOSIS — R1011 Right upper quadrant pain: Secondary | ICD-10-CM
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
## 2019-03-23 NOTE — ED Notes (Signed)
Attempt iv insertion x1 without success. Pt did not tolerate well. Danelle Earthly, rn to attempt US guided iv insertion.

## 2021-12-16 IMAGING — CT CT RENAL STONE PROTOCOL
2 of 4 series · 16 of 46 positions shown, 18 images · non-contrast
Comparison: None.

CLINICAL DATA: 38-year-old presenting with acute onset of LEFT
flank pain associated with nausea that began yesterday. Surgical
history includes umbilical hernia repair.

EXAM:
CT ABDOMEN AND PELVIS WITHOUT CONTRAST
TECHNIQUE: Multidetector CT imaging of the abdomen and pelvis was performed
following the standard protocol without IV contrast.

[Series 2: stone full standard · axial · 0.94mm/px · z∈[-425,+15]mm · 13 of 97 slices shown, 15 images]
[im 5/97  soft-tissue]
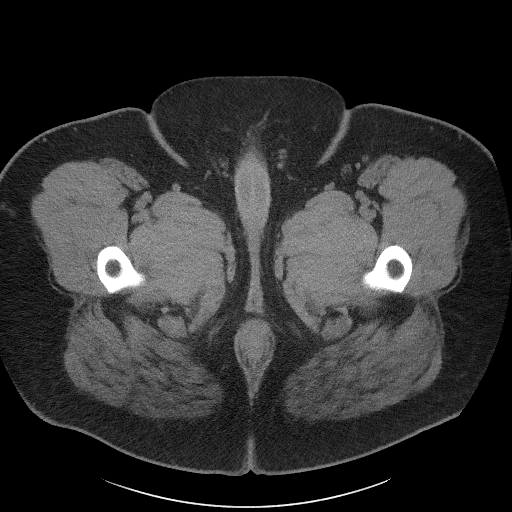
[im 5/97  bone]
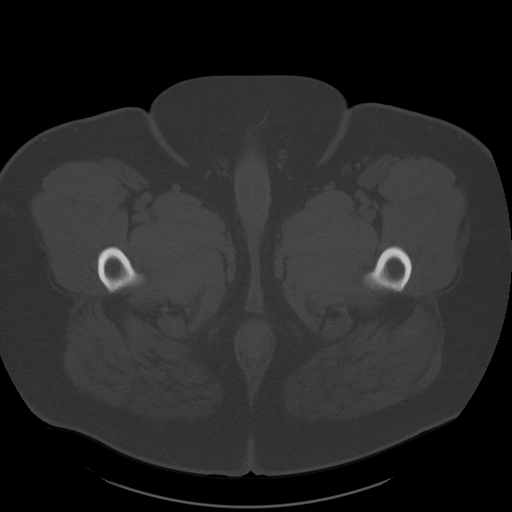
[im 13/97  soft-tissue]
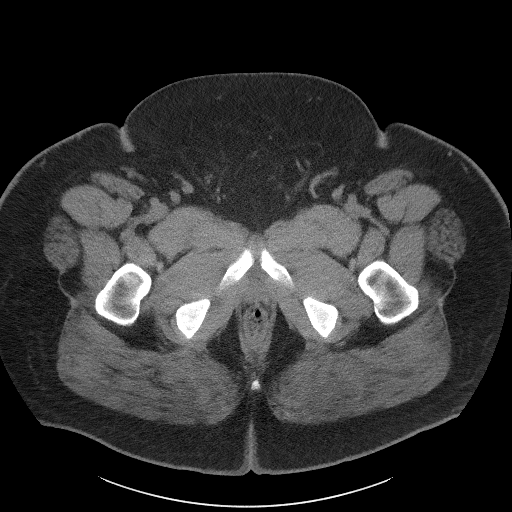
[im 21/97  soft-tissue]
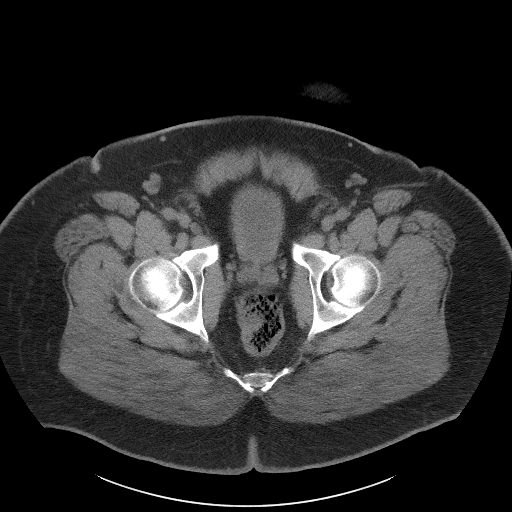
[im 29/97  soft-tissue]
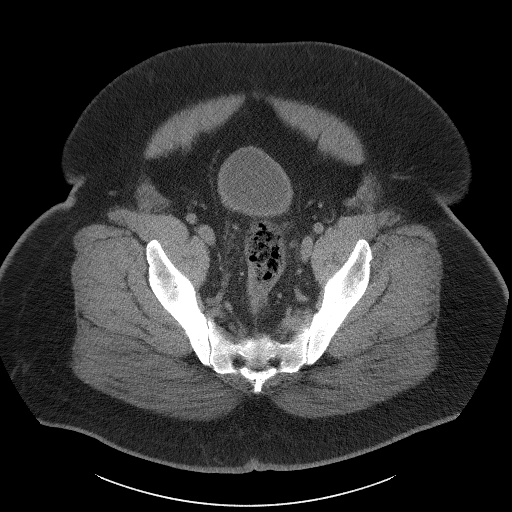
[im 33/97  soft-tissue]
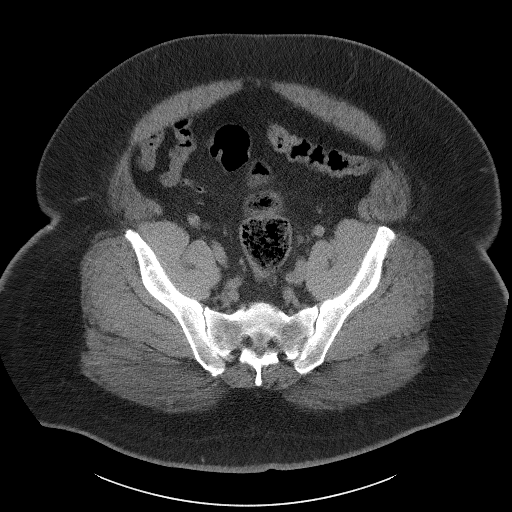
[im 41/97  soft-tissue]
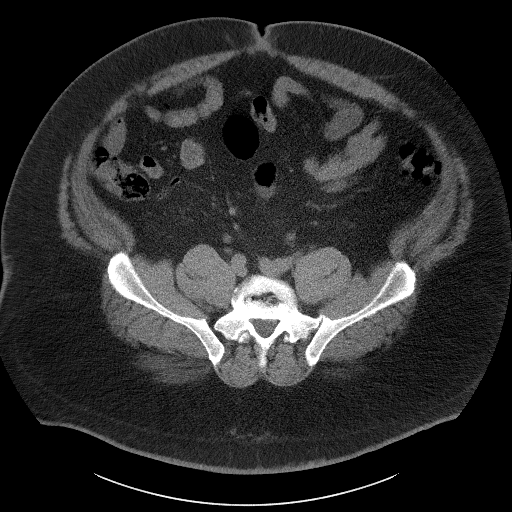
[im 49/97  soft-tissue]
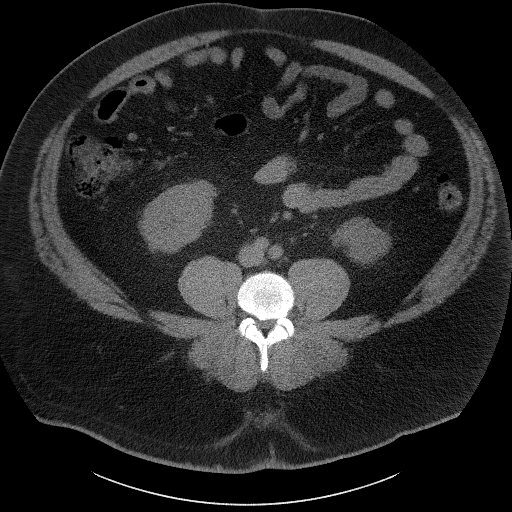
[im 57/97  soft-tissue]
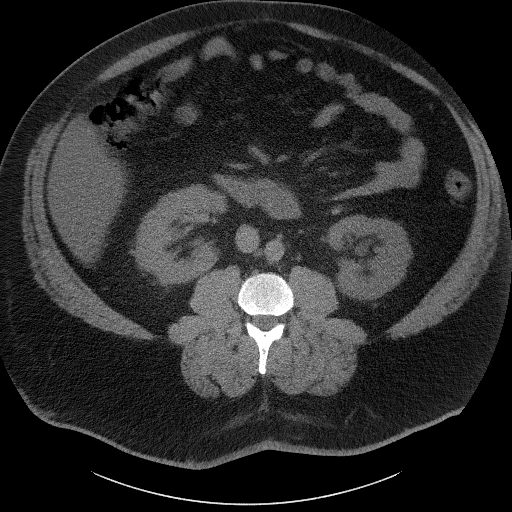
[im 65/97  soft-tissue]
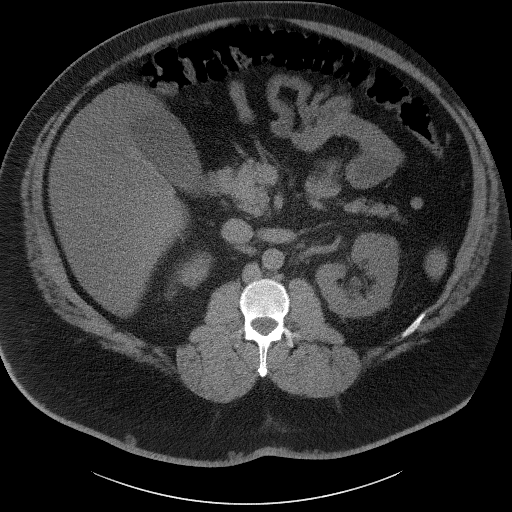
[im 65/97  bone]
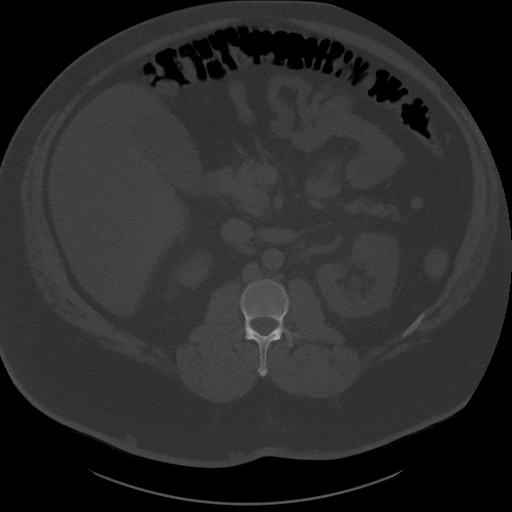
[im 69/97  soft-tissue]
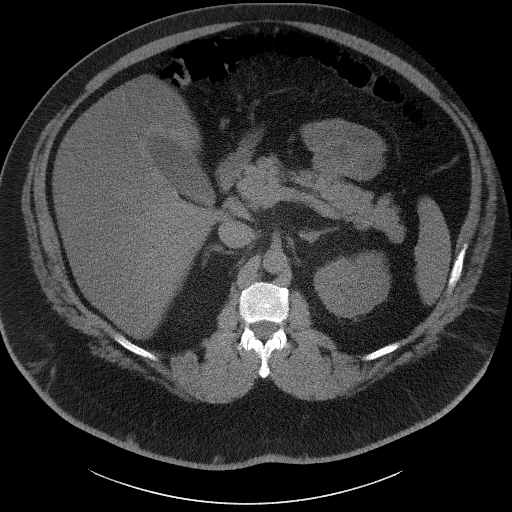
[im 77/97  soft-tissue]
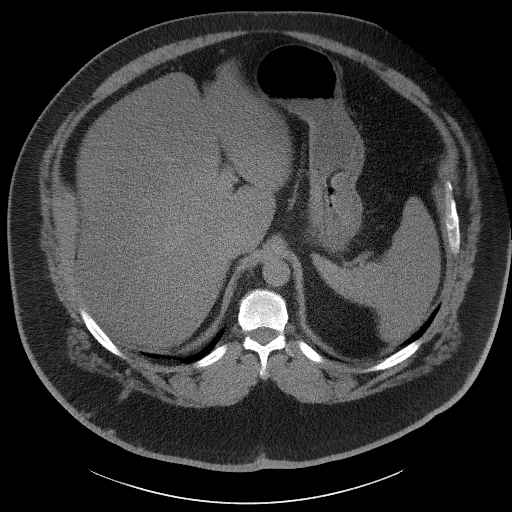
[im 85/97  soft-tissue]
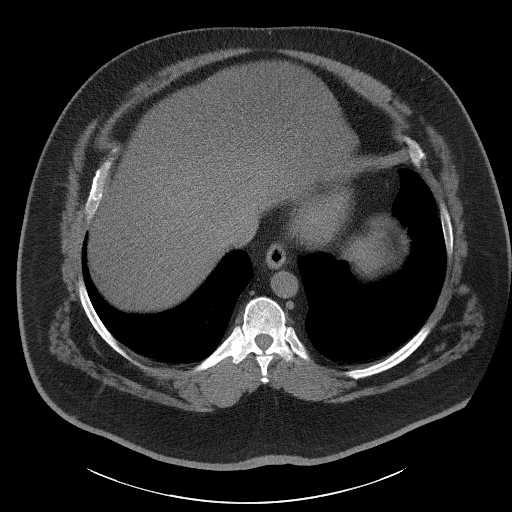
[im 93/97  soft-tissue]
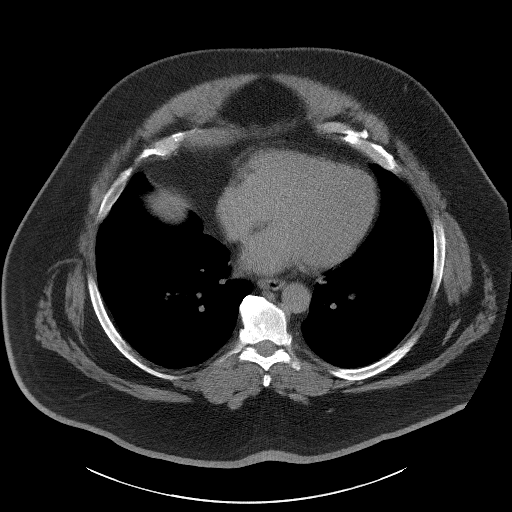

[Series 5: coronal · coronal · 0.96mm/px · 3 of 226 slices shown]
[im 76/226  soft-tissue]
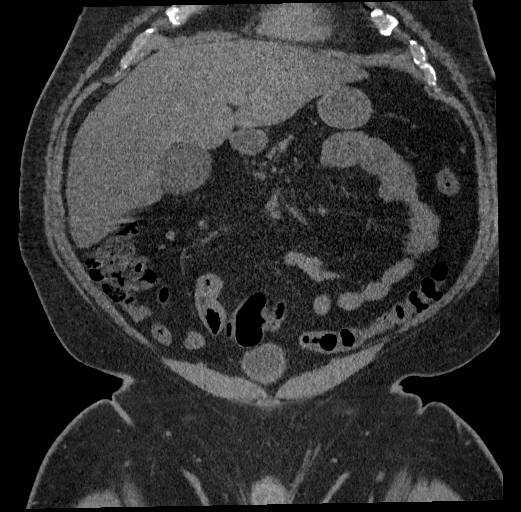
[im 101/226  soft-tissue]
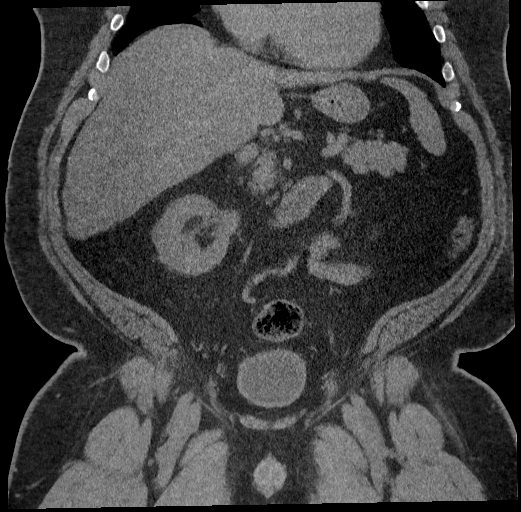
[im 126/226  soft-tissue]
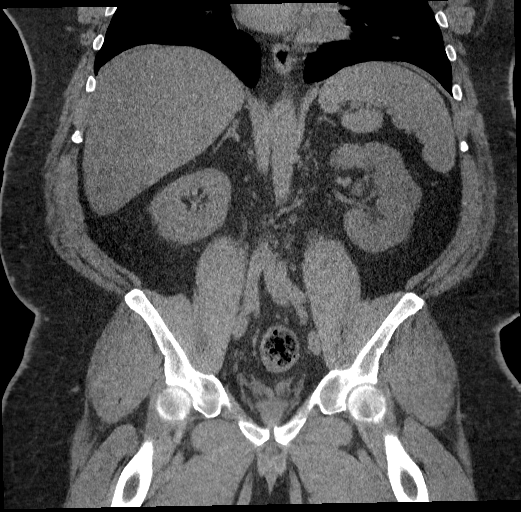

[16 of 46 positions shown; findings below may reference images not displayed]

FINDINGS: Lower chest: Heart size normal.  Visualized lung bases clear.

Hepatobiliary: Diffuse hepatic steatosis with focal sparing
surrounding the gallbladder. Allowing for the unenhanced technique,
no focal parenchymal abnormality involving the liver. Gallbladder
normal in appearance without calcified gallstones. No biliary ductal
dilation.

Pancreas: Normal in appearance without evidence of mass, ductal
dilation, or inflammation.

Spleen: Normal unenhanced appearance. Focus of accessory splenic
tissue anterior to the lower pole.

Adrenals/Urinary Tract: Normal appearing adrenal glands. Approximate
3 mm calculus at the LEFT UVJ causing mild LEFT hydronephrosis, LEFT
renal edema and LEFT perinephric edema. No urinary tract calculi
elsewhere on either side. Allowing for the unenhanced technique, no
focal parenchymal abnormality involving either kidney. Normal
appearing decompressed urinary bladder.

Stomach/Bowel: Stomach normal in appearance for the degree of
distention. Normal-appearing small bowel. Normal-appearing colon
with expected stool burden. Normal appendix in the RIGHT UPPER
pelvis.

Vascular/Lymphatic: No visible atherosclerosis. No pathologic
lymphadenopathy.

Reproductive: Prostate gland and seminal vesicles normal in size and
appearance for age.

Other: No evidence of recurrent umbilical hernia.

Musculoskeletal: Degenerative disc disease and spondylosis at L4-5
and L5-S1 with moderate multifactorial spinal stenosis at L4-5. No
acute findings.
IMPRESSION: 1. Obstructing approximate 3 mm calculus at the LEFT UVJ.
2. No urinary tract calculi elsewhere on either side.
3. Diffuse hepatic steatosis with focal sparing surrounding the
gallbladder.
4. Degenerative disc disease and spondylosis at L4-5 and L5-S1 with
moderate multifactorial spinal stenosis at L4-5.
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
